# Patient Record
Sex: Female | Born: 1990 | Race: Black or African American | Hispanic: No | Marital: Single | Smoking: Current every day smoker
Health system: Southern US, Community
[De-identification: ages and names within clinical notes are randomized; demographics above are authoritative.]

## PROBLEM LIST (undated history)

## (undated) DIAGNOSIS — E119 Type 2 diabetes mellitus without complications: Secondary | ICD-10-CM

---

## 2015-03-05 ENCOUNTER — Other Ambulatory Visit (HOSPITAL_COMMUNITY): Payer: Self-pay | Admitting: Specialist

## 2015-03-05 ENCOUNTER — Ambulatory Visit (HOSPITAL_COMMUNITY)
Admission: RE | Admit: 2015-03-05 | Discharge: 2015-03-05 | Disposition: A | Discharge status: Home / Self Care | Payer: Medicaid Other | Source: Ambulatory Visit | Attending: Specialist | Admitting: Specialist

## 2015-03-05 ENCOUNTER — Other Ambulatory Visit (HOSPITAL_COMMUNITY): Payer: Self-pay

## 2015-03-05 DIAGNOSIS — Z1389 Encounter for screening for other disorder: Secondary | ICD-10-CM

## 2015-03-05 DIAGNOSIS — Z3A21 21 weeks gestation of pregnancy: Secondary | ICD-10-CM | POA: Diagnosis not present

## 2015-03-05 DIAGNOSIS — O99212 Obesity complicating pregnancy, second trimester: Principal | ICD-10-CM

## 2015-03-05 DIAGNOSIS — O283 Abnormal ultrasonic finding on antenatal screening of mother: Secondary | ICD-10-CM

## 2015-04-10 ENCOUNTER — Other Ambulatory Visit (HOSPITAL_COMMUNITY): Payer: Self-pay | Admitting: Specialist

## 2015-04-10 ENCOUNTER — Encounter (HOSPITAL_COMMUNITY): Payer: Self-pay

## 2015-04-10 ENCOUNTER — Other Ambulatory Visit (HOSPITAL_COMMUNITY): Payer: Self-pay

## 2015-04-10 ENCOUNTER — Ambulatory Visit (HOSPITAL_COMMUNITY)
Admission: RE | Admit: 2015-04-10 | Discharge: 2015-04-10 | Disposition: A | Discharge status: Home / Self Care | Payer: Medicaid Other | Source: Ambulatory Visit | Attending: Specialist | Admitting: Specialist

## 2015-04-10 VITALS — BP 130/88 | HR 92 | Wt 296.0 lb

## 2015-04-10 DIAGNOSIS — Z3A26 26 weeks gestation of pregnancy: Secondary | ICD-10-CM

## 2015-04-10 DIAGNOSIS — Z36 Encounter for antenatal screening of mother: Secondary | ICD-10-CM | POA: Diagnosis present

## 2015-04-10 DIAGNOSIS — O24112 Pre-existing diabetes mellitus, type 2, in pregnancy, second trimester: Secondary | ICD-10-CM | POA: Insufficient documentation

## 2015-04-10 DIAGNOSIS — O99212 Obesity complicating pregnancy, second trimester: Secondary | ICD-10-CM | POA: Diagnosis not present

## 2015-04-10 DIAGNOSIS — O24919 Unspecified diabetes mellitus in pregnancy, unspecified trimester: Secondary | ICD-10-CM

## 2015-04-10 HISTORY — DX: Type 2 diabetes mellitus without complications: E11.9

## 2015-04-10 HISTORY — DX: Morbid (severe) obesity due to excess calories: E66.01

## 2015-04-12 ENCOUNTER — Other Ambulatory Visit (HOSPITAL_COMMUNITY): Payer: Self-pay

## 2015-04-18 ENCOUNTER — Encounter: Payer: Self-pay | Admitting: Maternal and Fetal Medicine

## 2015-04-24 ENCOUNTER — Ambulatory Visit (HOSPITAL_COMMUNITY)
Admission: RE | Admit: 2015-04-24 | Discharge: 2015-04-24 | Disposition: A | Discharge status: Home / Self Care | Payer: Medicaid Other | Source: Ambulatory Visit | Attending: Specialist | Admitting: Specialist

## 2015-04-24 DIAGNOSIS — O24919 Unspecified diabetes mellitus in pregnancy, unspecified trimester: Secondary | ICD-10-CM

## 2015-05-08 ENCOUNTER — Ambulatory Visit (HOSPITAL_COMMUNITY)
Admission: RE | Admit: 2015-05-08 | Discharge: 2015-05-08 | Disposition: A | Discharge status: Home / Self Care | Payer: Medicaid Other | Source: Ambulatory Visit | Attending: Maternal and Fetal Medicine | Admitting: Maternal and Fetal Medicine

## 2015-05-08 ENCOUNTER — Encounter (HOSPITAL_COMMUNITY): Payer: Self-pay

## 2015-05-08 ENCOUNTER — Ambulatory Visit (HOSPITAL_COMMUNITY): Payer: Medicaid Other

## 2015-05-08 DIAGNOSIS — Z36 Encounter for antenatal screening of mother: Secondary | ICD-10-CM | POA: Diagnosis not present

## 2015-05-08 DIAGNOSIS — O99213 Obesity complicating pregnancy, third trimester: Secondary | ICD-10-CM | POA: Insufficient documentation

## 2015-05-08 DIAGNOSIS — O24919 Unspecified diabetes mellitus in pregnancy, unspecified trimester: Secondary | ICD-10-CM

## 2015-05-08 DIAGNOSIS — O24113 Pre-existing diabetes mellitus, type 2, in pregnancy, third trimester: Secondary | ICD-10-CM | POA: Insufficient documentation

## 2015-05-08 DIAGNOSIS — Z3A30 30 weeks gestation of pregnancy: Secondary | ICD-10-CM | POA: Diagnosis not present

## 2015-06-05 ENCOUNTER — Other Ambulatory Visit (HOSPITAL_COMMUNITY): Payer: Self-pay | Admitting: Maternal and Fetal Medicine

## 2015-06-05 ENCOUNTER — Ambulatory Visit (HOSPITAL_COMMUNITY)
Admission: RE | Admit: 2015-06-05 | Discharge: 2015-06-05 | Disposition: A | Discharge status: Home / Self Care | Payer: Medicaid Other | Source: Ambulatory Visit | Attending: Specialist | Admitting: Specialist

## 2015-06-05 ENCOUNTER — Encounter (HOSPITAL_COMMUNITY): Payer: Self-pay

## 2015-06-05 DIAGNOSIS — Z3A34 34 weeks gestation of pregnancy: Secondary | ICD-10-CM | POA: Diagnosis not present

## 2015-06-05 DIAGNOSIS — O99213 Obesity complicating pregnancy, third trimester: Secondary | ICD-10-CM

## 2015-06-05 DIAGNOSIS — O24913 Unspecified diabetes mellitus in pregnancy, third trimester: Secondary | ICD-10-CM

## 2015-06-05 DIAGNOSIS — O24113 Pre-existing diabetes mellitus, type 2, in pregnancy, third trimester: Secondary | ICD-10-CM | POA: Diagnosis not present

## 2015-06-05 DIAGNOSIS — O24919 Unspecified diabetes mellitus in pregnancy, unspecified trimester: Secondary | ICD-10-CM

## 2016-02-13 ENCOUNTER — Encounter (HOSPITAL_COMMUNITY): Payer: Self-pay

## 2016-12-15 ENCOUNTER — Emergency Department (HOSPITAL_BASED_OUTPATIENT_CLINIC_OR_DEPARTMENT_OTHER)
Admission: EM | Admit: 2016-12-15 | Discharge: 2016-12-15 | Disposition: A | Discharge status: Home / Self Care | Payer: Medicaid Other | Attending: Physician Assistant | Admitting: Physician Assistant

## 2016-12-15 ENCOUNTER — Other Ambulatory Visit: Payer: Self-pay

## 2016-12-15 ENCOUNTER — Encounter (HOSPITAL_BASED_OUTPATIENT_CLINIC_OR_DEPARTMENT_OTHER): Payer: Self-pay

## 2016-12-15 DIAGNOSIS — A599 Trichomoniasis, unspecified: Secondary | ICD-10-CM | POA: Insufficient documentation

## 2016-12-15 DIAGNOSIS — F17210 Nicotine dependence, cigarettes, uncomplicated: Secondary | ICD-10-CM | POA: Insufficient documentation

## 2016-12-15 DIAGNOSIS — E119 Type 2 diabetes mellitus without complications: Secondary | ICD-10-CM | POA: Diagnosis not present

## 2016-12-15 DIAGNOSIS — Z202 Contact with and (suspected) exposure to infections with a predominantly sexual mode of transmission: Secondary | ICD-10-CM | POA: Diagnosis present

## 2016-12-15 LAB — URINALYSIS, ROUTINE W REFLEX MICROSCOPIC
Bilirubin Urine: NEGATIVE
Glucose, UA: NEGATIVE mg/dL
Ketones, ur: NEGATIVE mg/dL
Nitrite: NEGATIVE
Protein, ur: NEGATIVE mg/dL
Specific Gravity, Urine: >1.03 — ABNORMAL HIGH (ref 1.005–1.030)
pH: 6 (ref 5.0–8.0)

## 2016-12-15 LAB — URINALYSIS, MICROSCOPIC (REFLEX)

## 2016-12-15 LAB — PREGNANCY, URINE: Preg Test, Ur: NEGATIVE

## 2016-12-15 LAB — WET PREP, GENITAL
Clue Cells Wet Prep HPF POC: NONE SEEN
Sperm: NONE SEEN
Yeast Wet Prep HPF POC: NONE SEEN

## 2016-12-15 MED ORDER — ONDANSETRON 4 MG PO TBDP
4.0000 mg | ORAL_TABLET | Freq: Once | ORAL | Status: AC
  Administered 2016-12-15: 4 mg via ORAL
  Filled 2016-12-15: qty 1

## 2016-12-15 MED ORDER — CEFTRIAXONE SODIUM 250 MG IJ SOLR
250.0000 mg | Freq: Once | INTRAMUSCULAR | Status: AC
  Administered 2016-12-15: 250 mg via INTRAMUSCULAR
  Filled 2016-12-15: qty 250

## 2016-12-15 MED ORDER — METRONIDAZOLE 500 MG PO TABS
2000.0000 mg | ORAL_TABLET | Freq: Once | ORAL | Status: AC
  Administered 2016-12-15: 2000 mg via ORAL
  Filled 2016-12-15: qty 4

## 2016-12-15 MED ORDER — AZITHROMYCIN 250 MG PO TABS
1000.0000 mg | ORAL_TABLET | Freq: Once | ORAL | Status: AC
  Administered 2016-12-15: 1000 mg via ORAL
  Filled 2016-12-15: qty 4

## 2016-12-15 NOTE — ED Provider Notes (Signed)
[facility] EMERGENCY DEPARTMENT Provider Note   CSN: [id number] Arrival date & time: 12/15/16  1500     History   Chief Complaint Chief Complaint  Patient presents with  . Exposure to STD    HPI Andrea Wall is a 26 y.o. female.  HPI   Patient is a 26 year old female presenting because her female partner told her to come here to get checked because he has chlamydia.  Patient is G1P1.  Patient denies any discharge.  Denies any itching.  Denies any symptoms.    Past Medical History:  Diagnosis Date  . Diabetes mellitus without complication (HCC)   . Morbid obesity (HCC)     There are no active problems to display for this patient.   History reviewed. No pertinent surgical history.  OB History    Gravida Para Term Preterm AB Living   1             SAB TAB Ectopic Multiple Live Births                   Home Medications    Prior to Admission medications   Not on File    Family History No family history on file.  Social History Social History   Tobacco Use  . Smoking status: Current Every Day Smoker    Types: Cigarettes  . Smokeless tobacco: Never Used  Substance Use Topics  . Alcohol use: Yes    Frequency: Never    Comment: occ  . Drug use: No     Allergies   Patient has no known allergies.   Review of Systems Review of Systems  Constitutional: Negative for activity change.  Respiratory: Negative for shortness of breath.   Cardiovascular: Negative for chest pain.  Gastrointestinal: Negative for abdominal pain.  Genitourinary: Negative for genital sores, vaginal bleeding, vaginal discharge and vaginal pain.     Physical Exam Updated Vital Signs BP (!) 146/83 (BP Location: Left Arm)   Pulse 68   Temp 98.4 F (36.9 C) (Oral)   Resp 18   Ht 5\' 6"  (1.676 m)   Wt 127 kg (280 lb)   SpO2 97%   BMI 45.19 kg/m   Physical Exam  Constitutional: She is oriented to person, place, and time. She appears well-developed and  well-nourished.  HENT:  Head: Normocephalic and atraumatic.  Eyes: Right eye exhibits no discharge. Left eye exhibits no discharge.  Cardiovascular: Normal rate.  Pulmonary/Chest: Effort normal.  Abdominal: Soft. She exhibits no distension. There is no tenderness.  Genitourinary: Vagina normal.  Genitourinary Comments: Small amount of yellow discharge.  No CMT.  Neurological: She is oriented to person, place, and time.  Skin: Skin is warm and dry. She is not diaphoretic.  Psychiatric: She has a normal mood and affect.  Nursing note and vitals reviewed.    ED Treatments / Results  Labs (all labs ordered are listed, but only abnormal results are displayed) Labs Reviewed  WET PREP, GENITAL  PREGNANCY, URINE  URINALYSIS, ROUTINE W REFLEX MICROSCOPIC  RPR  HIV ANTIBODY (ROUTINE TESTING)  GC/CH[facility] AMP [facility]    EKG  EKG Interpretation None       Radiology No results found.  Procedures Procedures (including critical care time)  Medications Ordered in ED Medications  azithromycin (ZITHROMAX) tablet 1,000 mg (not administered)  cefTRIAXone (ROCEPHIN) injection 250 mg (not administered)  ondansetron (ZOFRAN-ODT) disintegrating tablet 4 mg (not administered)     Initial Impression /  Assessment and Plan / ED Course  I have reviewed the triage vital signs and the nursing notes.  Pertinent labs & imaging results that were available during my care of the patient were reviewed by me and considered in my medical decision making (see chart for details).     Patient is a 26 year old female presenting because her female partner told her to come here to get checked because he has chlamydia.  Patient is G1P1.  Patient denies any discharge.  Denies any itching.  Denies any symptoms.   3:44 PM Treat presumptively with azithromax ceftriaxone.  Will still send wet prep and GC chlamydia.  Will also order RPR and HIV given the high incidence of  coinfection.  Final Clinical Impressions(s) / ED Diagnoses   Final diagnoses:  None    ED Discharge Orders    None       Abelino DerrickMackuen, Lorene Klimas Lyn, MD 12/15/16 1545

## 2016-12-15 NOTE — ED Triage Notes (Signed)
Pt was advised by sexual partner of pos STD-NAD-steady gait

## 2016-12-15 NOTE — ED Provider Notes (Signed)
Pregnancy test is negative.  She has no symptoms at all and so the UA is likely more contaminant than a UTI.  Otherwise she has been treated with Rocephin, azithromycin, and Flagyl for the Trichomonas.  Discussed she will be followed up for the HIV.  Discussed return precautions.   Pricilla LovelessGoldston, Cherlyn Syring, MD 12/15/16 [phone]

## 2016-12-15 NOTE — ED Notes (Signed)
Urine collected and sent to lab.

## 2016-12-16 LAB — HIV ANTIBODY (ROUTINE TESTING W REFLEX): HIV Screen 4th Generation wRfx: NONREACTIVE

## 2016-12-16 LAB — RPR: RPR Ser Ql: NONREACTIVE

## 2016-12-16 LAB — GC/CHLAMYDIA PROBE AMP (~~LOC~~) NOT AT ARMC
Chlamydia: POSITIVE — AB
Neisseria Gonorrhea: POSITIVE — AB

## 2019-10-04 ENCOUNTER — Emergency Department (HOSPITAL_BASED_OUTPATIENT_CLINIC_OR_DEPARTMENT_OTHER)
Admission: EM | Admit: 2019-10-04 | Discharge: 2019-10-04 | Disposition: A | Discharge status: Home / Self Care | Payer: Medicaid Other | Attending: Emergency Medicine | Admitting: Emergency Medicine

## 2019-10-04 ENCOUNTER — Emergency Department (HOSPITAL_BASED_OUTPATIENT_CLINIC_OR_DEPARTMENT_OTHER): Payer: Medicaid Other

## 2019-10-04 ENCOUNTER — Other Ambulatory Visit: Payer: Self-pay

## 2019-10-04 ENCOUNTER — Encounter (HOSPITAL_BASED_OUTPATIENT_CLINIC_OR_DEPARTMENT_OTHER): Payer: Self-pay | Admitting: Emergency Medicine

## 2019-10-04 DIAGNOSIS — W1789XA Other fall from one level to another, initial encounter: Secondary | ICD-10-CM | POA: Insufficient documentation

## 2019-10-04 DIAGNOSIS — X501XXA Overexertion from prolonged static or awkward postures, initial encounter: Secondary | ICD-10-CM | POA: Diagnosis not present

## 2019-10-04 DIAGNOSIS — Y998 Other external cause status: Secondary | ICD-10-CM | POA: Diagnosis not present

## 2019-10-04 DIAGNOSIS — F17210 Nicotine dependence, cigarettes, uncomplicated: Secondary | ICD-10-CM | POA: Insufficient documentation

## 2019-10-04 DIAGNOSIS — E119 Type 2 diabetes mellitus without complications: Secondary | ICD-10-CM | POA: Insufficient documentation

## 2019-10-04 DIAGNOSIS — Y9289 Other specified places as the place of occurrence of the external cause: Secondary | ICD-10-CM | POA: Insufficient documentation

## 2019-10-04 DIAGNOSIS — S99912A Unspecified injury of left ankle, initial encounter: Secondary | ICD-10-CM | POA: Diagnosis present

## 2019-10-04 DIAGNOSIS — Y9389 Activity, other specified: Secondary | ICD-10-CM | POA: Diagnosis not present

## 2019-10-04 DIAGNOSIS — S93402A Sprain of unspecified ligament of left ankle, initial encounter: Secondary | ICD-10-CM | POA: Diagnosis not present

## 2019-10-04 MED ORDER — IBUPROFEN 800 MG PO TABS
800.0000 mg | ORAL_TABLET | Freq: Once | ORAL | Status: AC
  Administered 2019-10-04: 800 mg via ORAL
  Filled 2019-10-04: qty 1

## 2019-10-04 MED ORDER — ACETAMINOPHEN 500 MG PO TABS
1000.0000 mg | ORAL_TABLET | Freq: Once | ORAL | Status: AC
  Administered 2019-10-04: 1000 mg via ORAL
  Filled 2019-10-04: qty 2

## 2019-10-04 MED ORDER — NAPROXEN 375 MG PO TABS: 375.0000 mg | ORAL_TABLET | Freq: Two times a day (BID) | ORAL | 0 refills | Status: DC

## 2019-10-04 NOTE — ED Provider Notes (Signed)
[facility] EMERGENCY DEPARTMENT Provider Note   CSN: [id number] Arrival date & time: 10/04/19  0133     History Chief Complaint  Patient presents with  . Ankle Injury    Andrea Wall is a 29 y.o. female.  The history is provided by the patient.  Ankle Injury This is a new problem. The current episode started 2 days ago. The problem occurs constantly. The problem has not changed since onset.Pertinent negatives include no chest pain, no abdominal pain, no headaches and no shortness of breath. Nothing aggravates the symptoms. Nothing relieves the symptoms. She has tried nothing for the symptoms. The treatment provided no relief.  Stepped off porch funny twisting left ankle.      Past Medical History:  Diagnosis Date  . Diabetes mellitus without complication (HCC)   . Morbid obesity (HCC)     There are no problems to display for this patient.   History reviewed. No pertinent surgical history.   OB History    Gravida  1   Para      Term      Preterm      AB      Living        SAB      TAB      Ectopic      Multiple      Live Births              Family History  Problem Relation Age of Onset  . Hypertension Mother   . Diabetes Other     Social History   Tobacco Use  . Smoking status: Current Every Day Smoker    Packs/day: 0.25    Types: Cigarettes  . Smokeless tobacco: Never Used  Vaping Use  . Vaping Use: Never used  Substance Use Topics  . Alcohol use: Not Currently    Comment: occ  . Drug use: No    Home Medications Prior to Admission medications   Not on File    Allergies    Patient has no known allergies.  Review of Systems   Review of Systems  Constitutional: Negative for fever.  HENT: Negative for congestion.   Eyes: Negative for visual disturbance.  Respiratory: Negative for shortness of breath.   Cardiovascular: Negative for chest pain.  Gastrointestinal: Negative for abdominal pain.  Genitourinary:  Negative for difficulty urinating.  Neurological: Negative for headaches.  All other systems reviewed and are negative.   Physical Exam Updated Vital Signs BP (!) 129/102 (BP Location: Left Arm)   Pulse 73   Temp 99.3 F (37.4 C) (Oral)   Resp 16   Ht 5\' 6"  (1.676 m)   Wt 129.3 kg   LMP 09/14/2019 (Exact Date)   SpO2 100%   BMI 46.00 kg/m   Physical Exam Vitals and nursing note reviewed.  Constitutional:      General: She is not in acute distress.    Appearance: Normal appearance.  HENT:     Head: Normocephalic and atraumatic.     Nose: Nose normal.  Eyes:     Conjunctiva/sclera: Conjunctivae normal.     Pupils: Pupils are equal, round, and reactive to light.  Cardiovascular:     Rate and Rhythm: Normal rate and regular rhythm.     Pulses: Normal pulses.     Heart sounds: Normal heart sounds.  Pulmonary:     Effort: Pulmonary effort is normal.     Breath sounds: Normal breath sounds.  Abdominal:  General: Abdomen is flat. Bowel sounds are normal.     Palpations: Abdomen is soft.     Tenderness: There is no abdominal tenderness. There is no guarding.  Musculoskeletal:        General: Normal range of motion.     Cervical back: Normal range of motion and neck supple.     Left lower leg: Normal.     Left ankle: Normal.     Left Achilles Tendon: Normal.     Left foot: Normal.  Skin:    General: Skin is warm and dry.     Capillary Refill: Capillary refill takes less than 2 seconds.  Neurological:     General: No focal deficit present.     Mental Status: She is alert and oriented to person, place, and time.     Deep Tendon Reflexes: Reflexes normal.  Psychiatric:        Mood and Affect: Mood normal.        Behavior: Behavior normal.     ED Results / Procedures / Treatments   Labs (all labs ordered are listed, but only abnormal results are displayed) Labs Reviewed - No data to display  EKG None  Radiology DG Ankle Complete Left  Result Date:  10/04/2019 CLINICAL DATA:  Status post fall. EXAM: LEFT ANKLE COMPLETE - 3+ VIEW COMPARISON:  None. FINDINGS: There is no evidence of an acute fracture, dislocation, or joint effusion. A small area of chronic appearing cortical attenuation is seen adjacent to the anterior aspect of the distal left fibula on the lateral view. There is no evidence of arthropathy. Mild lateral soft tissue swelling is noted. IMPRESSION: Mild lateral soft tissue swelling without evidence of acute fracture or dislocation. Electronically Signed   By: Aram Candela M.D.   On: 10/04/2019 02:08    Procedures Procedures (including critical care time)  Medications Ordered in ED Medications  acetaminophen (TYLENOL) tablet 1,000 mg (1,000 mg Oral Given 10/04/19 0404)  ibuprofen (ADVIL) tablet 800 mg (800 mg Oral Given 10/04/19 0404)    ED Course  I have reviewed the triage vital signs and the nursing notes.  Pertinent labs & imaging results that were available during my care of the patient were reviewed by me and considered in my medical decision making (see chart for details).    Mild ankle sprain.  Patient has FROM.  Xray is without fracture.  Achilles tendon is intact.  Ice, elevation and NSAIDs.   Andrea Wall was evaluated in Emergency Department on 10/04/2019 for the symptoms described in the history of present illness. She was evaluated in the context of the global COVID-19 pandemic, which necessitated consideration that the patient might be at risk for infection with the SARS-CoV-2 virus that causes COVID-19. Institutional protocols and algorithms that pertain to the evaluation of patients at risk for COVID-19 are in a state of rapid change based on information released by regulatory bodies including the CDC and federal and state organizations. These policies and algorithms were followed during the patient's care in the ED.  Final Clinical Impression(s) / ED Diagnoses Return for intractable cough, coughing up  blood,fevers >100.4 unrelieved by medication, shortness of breath, intractable vomiting, chest pain, shortness of breath, weakness,numbness, changes in speech, facial asymmetry,abdominal pain, passing out,Inability to tolerate liquids or food, cough, altered mental status or any concerns. No signs of systemic illness or infection. The patient is nontoxic-appearing on exam and vital signs are within normal limits.   I have reviewed the triage vital signs  and the nursing notes. Pertinent labs &imaging results that were available during my care of the patient were reviewed by me and considered in my medical decision making (see chart for details).After history, exam, and medical workup I feel the patient has beenappropriately medically screened and is safe for discharge home. Pertinent diagnoses were discussed with the patient. Patient was given return precautions.   Andrea Vandervort, MD 10/04/19 [phone]

## 2019-10-04 NOTE — ED Triage Notes (Signed)
Pt states she slipped and fell off her porch Tuesday morning  Pt is c/o left ankle pain   Pt reports swelling  Pt is able to ambulate on it

## 2019-11-20 DIAGNOSIS — U071 COVID-19: Secondary | ICD-10-CM

## 2019-11-20 HISTORY — DX: COVID-19: U07.1

## 2019-11-27 ENCOUNTER — Emergency Department (HOSPITAL_BASED_OUTPATIENT_CLINIC_OR_DEPARTMENT_OTHER)
Admission: EM | Admit: 2019-11-27 | Discharge: 2019-11-28 | Disposition: A | Discharge status: Home / Self Care | Payer: Medicaid Other | Attending: Emergency Medicine | Admitting: Emergency Medicine

## 2019-11-27 ENCOUNTER — Other Ambulatory Visit: Payer: Self-pay

## 2019-11-27 ENCOUNTER — Encounter (HOSPITAL_BASED_OUTPATIENT_CLINIC_OR_DEPARTMENT_OTHER): Payer: Self-pay | Admitting: *Deleted

## 2019-11-27 DIAGNOSIS — E119 Type 2 diabetes mellitus without complications: Secondary | ICD-10-CM | POA: Insufficient documentation

## 2019-11-27 DIAGNOSIS — U071 COVID-19: Secondary | ICD-10-CM | POA: Insufficient documentation

## 2019-11-27 DIAGNOSIS — R059 Cough, unspecified: Secondary | ICD-10-CM | POA: Diagnosis present

## 2019-11-27 DIAGNOSIS — F17210 Nicotine dependence, cigarettes, uncomplicated: Secondary | ICD-10-CM | POA: Insufficient documentation

## 2019-11-27 LAB — RESPIRATORY PANEL BY RT PCR (FLU A&B, COVID)
Influenza A by PCR: NEGATIVE
Influenza B by PCR: NEGATIVE
SARS Coronavirus 2 by RT PCR: POSITIVE — AB

## 2019-11-27 NOTE — ED Triage Notes (Signed)
C/o cough , congestion , loss of taste , smell x 6 days

## 2019-11-27 NOTE — ED Notes (Signed)
Last wed, began having aching legs, Thursday began feeling fatigued and lost taste and smell, did have some congestion as well

## 2019-11-28 ENCOUNTER — Encounter: Payer: Self-pay | Admitting: Nurse Practitioner

## 2019-11-28 DIAGNOSIS — E119 Type 2 diabetes mellitus without complications: Secondary | ICD-10-CM | POA: Insufficient documentation

## 2019-11-28 MED ORDER — ALBUTEROL SULFATE HFA 108 (90 BASE) MCG/ACT IN AERS: 2.0000 | INHALATION_SPRAY | Freq: Once | RESPIRATORY_TRACT | Status: DC | PRN

## 2019-11-28 MED ORDER — EPINEPHRINE 0.3 MG/0.3ML IJ SOAJ: 0.3000 mg | Freq: Once | INTRAMUSCULAR | Status: DC | PRN

## 2019-11-28 MED ORDER — METHYLPREDNISOLONE SODIUM SUCC 125 MG IJ SOLR: 125.0000 mg | Freq: Once | INTRAMUSCULAR | Status: DC | PRN

## 2019-11-28 MED ORDER — SODIUM CHLORIDE 0.9 % IV SOLN: 1200.0000 mg | Freq: Once | INTRAVENOUS | Status: DC

## 2019-11-28 MED ORDER — DIPHENHYDRAMINE HCL 50 MG/ML IJ SOLN: 50.0000 mg | Freq: Once | INTRAMUSCULAR | Status: DC | PRN

## 2019-11-28 MED ORDER — SODIUM CHLORIDE 0.9 % IV SOLN: INTRAVENOUS | Status: DC | PRN

## 2019-11-28 MED ORDER — FAMOTIDINE IN NACL 20-0.9 MG/50ML-% IV SOLN: 20.0000 mg | Freq: Once | INTRAVENOUS | Status: DC | PRN

## 2019-11-28 NOTE — ED Provider Notes (Signed)
[facility] Provider Note: Lowella Dell, MD, FACEP  CSN: [id number] MRN: [id number] ARRIVAL: 11/27/19 at 1859 ROOM: [facility]   CHIEF COMPLAINT  URI   HISTORY OF PRESENT ILLNESS  11/28/19 12:23 AM Andrea Wall is a 29 y.o. female with a 6-day history of body aches, primarily in her legs and back.  For the past 4 days she has had a loss of taste and smell which caused her to be concerned she had Covid.  Yesterday at work she developed sweats which further concerned her.  She has not had a significant fever.  She has not had nasal congestion, sore throat, nausea, vomiting or diarrhea.  She has had a cough but no significant shortness of breath.   Past Medical History:  Diagnosis Date  . Diabetes mellitus without complication (HCC)   . Morbid obesity (HCC)     History reviewed. No pertinent surgical history.  Family History  Problem Relation Age of Onset  . Hypertension Mother   . Diabetes Other     Social History   Tobacco Use  . Smoking status: Current Every Day Smoker    Packs/day: 0.25    Types: Cigarettes  . Smokeless tobacco: Never Used  Vaping Use  . Vaping Use: Never used  Substance Use Topics  . Alcohol use: Not Currently    Comment: occ  . Drug use: No    Prior to Admission medications   Medication Sig Start Date End Date Taking? Authorizing Provider  naproxen (NAPROSYN) 375 MG tablet Take 1 tablet (375 mg total) by mouth 2 (two) times daily. 10/04/19   Palumbo, April, MD    Allergies Patient has no known allergies.   REVIEW OF SYSTEMS  Negative except as noted here or in the History of Present Illness.   PHYSICAL EXAMINATION  Initial Vital Signs Blood pressure (!) 134/92, pulse 74, temperature 99 F (37.2 C), temperature source Oral, resp. rate 16, height 5\' 6"  (1.676 m), weight 127 kg, last menstrual period 10/24/2019, SpO2 100 %, unknown if currently breastfeeding.  Examination General: Well-developed, well-nourished female  in no acute distress; appearance consistent with age of record HENT: normocephalic; atraumatic; no pharyngeal erythema or exudate Eyes: pupils equal, round and reactive to light; extraocular muscles intact Neck: supple Heart: regular rate and rhythm Lungs: clear to auscultation bilaterally Abdomen: soft; nondistended; nontender; bowel sounds present Extremities: No deformity; full range of motion; pulses normal Neurologic: Awake, alert and oriented; motor function intact in all extremities and symmetric; no facial droop Skin: Warm and dry Psychiatric: Normal mood and affect   RESULTS  Summary of this visit's results, reviewed and interpreted by myself:   EKG Interpretation  Date/Time:    Ventricular Rate:    PR Interval:    QRS Duration:   QT Interval:    QTC Calculation:   R Axis:     Text Interpretation:        Laboratory Studies: Results for orders placed or performed during the hospital encounter of 11/27/19 (from the past 24 hour(s))  Respiratory Panel by RT PCR (Flu A&B, Covid) - Nasopharyngeal Swab     Status: Abnormal   Collection Time: 11/27/19  7:07 PM   Specimen: Nasopharyngeal Swab  Result Value Ref Range   SARS Coronavirus 2 by RT PCR POSITIVE (A) NEGATIVE   Influenza A by PCR NEGATIVE NEGATIVE   Influenza B by PCR NEGATIVE NEGATIVE   Imaging Studies: No results found.  ED COURSE and MDM  Nursing notes, initial and  subsequent vitals signs, including pulse oximetry, reviewed and interpreted by myself.  Vitals:   11/27/19 1908 11/27/19 2331 11/27/19 2357  BP: (!) 155/89 122/88 (!) 134/92  Pulse: 77 79 74  Resp: 18 14 16   Temp: 98.4 F (36.9 C) 99 F (37.2 C)   TempSrc: Oral Oral   SpO2: 100% 96% 100%  Weight: 127 kg    Height: 5\' 6"  (1.676 m)     Medications  casirivimab-imdevimab (REGEN-COV) 1,200 mg in sodium chloride 0.9 % 110 mL IVPB (has no administration in time range)  0.9 %  sodium chloride infusion (has no administration in time range)    diphenhydrAMINE (BENADRYL) injection 50 mg (has no administration in time range)  famotidine (PEPCID) IVPB 20 mg premix (has no administration in time range)  methylPREDNISolone sodium succinate (SOLU-MEDROL) 125 mg/2 mL injection 125 mg (has no administration in time range)  albuterol (VENTOLIN HFA) 108 (90 Base) MCG/ACT inhaler 2 puff (has no administration in time range)  EPINEPHrine (EPI-PEN) injection 0.3 mg (has no administration in time range)    The patient does qualify for the monoclonal antibody infusion and she was offered the opportunity.  1:55 AM After reading the informed consent documents the patient declines the monoclonal antibody infusion.  She was advised she may return if she changes her mind or her symptoms worsen.  PROCEDURES  Procedures   ED DIAGNOSES     ICD-10-CM   1. COVID-19 virus infection  U07.1        Demitra Danley, MD 11/28/19 [phone]

## 2019-11-28 NOTE — ED Notes (Signed)
ED Provider at bedside. 

## 2020-05-23 ENCOUNTER — Emergency Department (HOSPITAL_BASED_OUTPATIENT_CLINIC_OR_DEPARTMENT_OTHER)
Admission: EM | Admit: 2020-05-23 | Discharge: 2020-05-23 | Disposition: A | Discharge status: Home / Self Care | Payer: Medicaid Other | Attending: Emergency Medicine | Admitting: Emergency Medicine

## 2020-05-23 ENCOUNTER — Other Ambulatory Visit (HOSPITAL_BASED_OUTPATIENT_CLINIC_OR_DEPARTMENT_OTHER): Payer: Self-pay

## 2020-05-23 ENCOUNTER — Other Ambulatory Visit: Payer: Self-pay

## 2020-05-23 ENCOUNTER — Encounter (HOSPITAL_BASED_OUTPATIENT_CLINIC_OR_DEPARTMENT_OTHER): Payer: Self-pay

## 2020-05-23 DIAGNOSIS — E119 Type 2 diabetes mellitus without complications: Secondary | ICD-10-CM | POA: Diagnosis not present

## 2020-05-23 DIAGNOSIS — X58XXXA Exposure to other specified factors, initial encounter: Secondary | ICD-10-CM | POA: Diagnosis not present

## 2020-05-23 DIAGNOSIS — S76312A Strain of muscle, fascia and tendon of the posterior muscle group at thigh level, left thigh, initial encounter: Secondary | ICD-10-CM | POA: Diagnosis not present

## 2020-05-23 DIAGNOSIS — F17210 Nicotine dependence, cigarettes, uncomplicated: Secondary | ICD-10-CM | POA: Insufficient documentation

## 2020-05-23 DIAGNOSIS — U071 COVID-19: Secondary | ICD-10-CM | POA: Diagnosis not present

## 2020-05-23 DIAGNOSIS — M545 Low back pain, unspecified: Secondary | ICD-10-CM | POA: Diagnosis not present

## 2020-05-23 DIAGNOSIS — S79922A Unspecified injury of left thigh, initial encounter: Secondary | ICD-10-CM | POA: Diagnosis present

## 2020-05-23 MED ORDER — MELOXICAM 15 MG PO TABS
15.0000 mg | ORAL_TABLET | Freq: Every day | ORAL | 0 refills | Status: AC
  Filled 2020-05-23: qty 10, 10d supply, fill #0

## 2020-05-23 MED ORDER — METHOCARBAMOL 500 MG PO TABS
1000.0000 mg | ORAL_TABLET | Freq: Every evening | ORAL | 0 refills | Status: AC | PRN
  Filled 2020-05-23: qty 20, 10d supply, fill #0

## 2020-05-23 NOTE — Discharge Instructions (Signed)
Please read and follow all provided instructions.  Your diagnoses today include:  1. Hamstring strain, left, initial encounter     Tests performed today include: Vital signs. See below for your results today.   Medications prescribed:  Meloxicam - anti-inflammatory pain medication  You have been prescribed an anti-inflammatory medication or NSAID. Take with food. Do not take aspirin, ibuprofen, or naproxen if taking this medication. Take smallest effective dose for the shortest duration needed for your pain. Stop taking if you experience stomach pain or vomiting.   Robaxin (methocarbamol) - muscle relaxer medication  DO NOT drive or perform any activities that require you to be awake and alert because this medicine can make you drowsy.   Take any prescribed medications only as directed.  Home care instructions:  Follow any educational materials contained in this packet.  BE VERY CAREFUL not to take multiple medicines containing Tylenol (also called acetaminophen). Doing so can lead to an overdose which can damage your liver and cause liver failure and possibly death.   Follow-up instructions: Please follow-up with Dr. Jordan Likes in 1 week if your pain is not improving.   Return instructions:  Please return to the Emergency Department if you experience worsening symptoms.  Please return if you have any other emergent concerns.  Additional Information:  Your vital signs today were: BP (!) 151/89 (BP Location: Left Arm)   Pulse 88   Temp 98.6 F (37 C) (Oral)   Resp 18   LMP 05/10/2020   SpO2 100%  If your blood pressure (BP) was elevated above 135/85 this visit, please have this repeated by your doctor within one month. --------------

## 2020-05-23 NOTE — ED Notes (Signed)
Present with leg pain - back of upper leg for two weeks.  Worse with squatting or sitting

## 2020-05-23 NOTE — ED Provider Notes (Signed)
[facility] EMERGENCY DEPARTMENT Provider Note   CSN: [id number] Arrival date & time: 05/23/20  1404     History Chief Complaint  Patient presents with  . Leg Pain    Andrea Wall is a 30 y.o. female.  Patient presents the emergency department for evaluation of posterior left-sided upper leg pain and has been occurring for about 2 weeks.  Pain is worse with palpation and with sitting.  She states that the pain has been keeping her up at night recently and is described as throbbing.  She has some mild generalized lower back pain.  No numbness or tingling distally.  No swelling noted.  No erythema or redness.  She has taken over-the-counter medications without improvement.  Patient denies risk factors for DVT including: unilateral leg swelling, history of DVT/PE/other blood clots, use of exogenous hormones, recent immobilizations, recent surgery, recent travel (>4hr segment), malignancy, hemoptysis.  No history of back problems.         Past Medical History:  Diagnosis Date  . COVID-19 virus infection 11/2019  . Diabetes mellitus without complication (HCC)   . Morbid obesity [facility])     Patient Active Problem List   Diagnosis Date Noted  . Morbid obesity (HCC)   . Diabetes mellitus without complication (HCC)   . COVID-19 virus infection 11/2019    History reviewed. No pertinent surgical history.   OB History    Gravida  1   Para      Term      Preterm      AB      Living        SAB      IAB      Ectopic      Multiple      Live Births              Family History  Problem Relation Age of Onset  . Hypertension Mother   . Diabetes Other     Social History   Tobacco Use  . Smoking status: Current Every Day Smoker    Packs/day: 0.25    Types: Cigarettes  . Smokeless tobacco: Never Used  Vaping Use  . Vaping Use: Never used  Substance Use Topics  . Alcohol use: Yes    Comment: occ  . Drug use: No    Home Medications Prior to  Admission medications   Medication Sig Start Date End Date Taking? Authorizing Provider  naproxen (NAPROSYN) 375 MG tablet Take 1 tablet (375 mg total) by mouth 2 (two) times daily. 10/04/19   Palumbo, April, MD    Allergies    Patient has no known allergies.  Review of Systems   Review of Systems  Constitutional: Negative for activity change.  Musculoskeletal: Positive for back pain (mild, bilateral but mostly R sided) and myalgias. Negative for arthralgias, joint swelling and neck pain.  Skin: Negative for wound.  Neurological: Negative for weakness and numbness.    Physical Exam Updated Vital Signs BP (!) 151/89 (BP Location: Left Arm)   Pulse 88   Temp 98.6 F (37 C) (Oral)   Resp 18   LMP 05/10/2020   SpO2 100%   Physical Exam Vitals and nursing note reviewed.  Constitutional:      Appearance: She is well-developed.  HENT:     Head: Normocephalic and atraumatic.  Eyes:     Pupils: Pupils are equal, round, and reactive to light.  Cardiovascular:     Pulses: Normal pulses. No decreased pulses.  Musculoskeletal:        General: Tenderness present.     Cervical back: Normal range of motion and neck supple.     Right lower leg: No edema.     Left lower leg: No edema.     Comments: Patient with tenderness to palpation over the left hamstring from the posterior mid thigh to the upper thigh.  No swelling, erythema, redness or warmth of the skin.  No palpable abscess or any sign of infection.  Minimal tenderness to palpation over the lumbar paraspinous musculature.  Skin:    General: Skin is warm and dry.  Neurological:     Mental Status: She is alert.     Sensory: No sensory deficit.     Comments: Motor, sensation, and vascular distal to the injury is fully intact.  Patient is able to stand up and walk readily.     ED Results / Procedures / Treatments   Labs (all labs ordered are listed, but only abnormal results are displayed) Labs Reviewed - No data to  display  EKG None  Radiology No results found.  Procedures Procedures   Medications Ordered in ED Medications - No data to display  ED Course  I have reviewed the triage vital signs and the nursing notes.  Pertinent labs & imaging results that were available during my care of the patient were reviewed by me and considered in my medical decision making (see chart for details).  Patient seen and examined.   Vital signs reviewed and are as follows: BP (!) 151/89 (BP Location: Left Arm)   Pulse 88   Temp 98.6 F (37 C) (Oral)   Resp 18   LMP 05/10/2020   SpO2 100%   Patient's symptoms are most consistent with a hamstring strain.  Also considered lumbar radicular pain and DVT.  Discussed risk factors of DVT with patient.  We both feel that this is low likelihood and she agrees to defer ultrasonography.  Discussed that x-ray would be of limited utility today.  Will treat with Robaxin and meloxicam.  If symptoms are not improved by the end of the weekend, patient will follow-up with sports medicine and she is given a referral.  She verbalizes understanding agrees with plan.    MDM Rules/Calculators/A&P                          Patient with likely MSK pain with considerations as above.  No lower extremity swelling or significant risk factors for DVT.  No signs of infection.  This is likely musculoskeletal or neuropathic in nature.  Conservative measures prescribed as above.  Sports medicine follow-up if needed.    Final Clinical Impression(s) / ED Diagnoses Final diagnoses:  Hamstring strain, left, initial encounter    Rx / DC Orders ED Discharge Orders         Ordered    meloxicam (MOBIC) 15 MG tablet  Daily        05/23/20 1447    methocarbamol (ROBAXIN) 500 MG tablet  At bedtime PRN        05/23/20 1447           Renne Crigler, PA-C 05/23/20 1454    Pricilla Loveless, MD 05/23/20 1504

## 2020-05-23 NOTE — ED Triage Notes (Signed)
Pt c/o pain to posterior left LE x 2 weeks-denies injury-limping gait-NAD
